# Patient Record
Sex: Female | Born: 1975 | Race: White | Hispanic: No | Marital: Married | Smoking: Current every day smoker
Health system: Southern US, Community
[De-identification: ages and names within clinical notes are randomized; demographics above are authoritative.]

## PROBLEM LIST (undated history)

## (undated) DIAGNOSIS — O09529 Supervision of elderly multigravida, unspecified trimester: Secondary | ICD-10-CM

## (undated) DIAGNOSIS — R011 Cardiac murmur, unspecified: Secondary | ICD-10-CM

## (undated) DIAGNOSIS — I10 Essential (primary) hypertension: Secondary | ICD-10-CM

## (undated) DIAGNOSIS — A630 Anogenital (venereal) warts: Secondary | ICD-10-CM

## (undated) DIAGNOSIS — B009 Herpesviral infection, unspecified: Secondary | ICD-10-CM

## (undated) DIAGNOSIS — Z789 Other specified health status: Secondary | ICD-10-CM

## (undated) DIAGNOSIS — Z87440 Personal history of urinary (tract) infections: Secondary | ICD-10-CM

## (undated) DIAGNOSIS — Z8742 Personal history of other diseases of the female genital tract: Secondary | ICD-10-CM

## (undated) DIAGNOSIS — Z8619 Personal history of other infectious and parasitic diseases: Secondary | ICD-10-CM

## (undated) DIAGNOSIS — B977 Papillomavirus as the cause of diseases classified elsewhere: Secondary | ICD-10-CM

## (undated) HISTORY — DX: Papillomavirus as the cause of diseases classified elsewhere: B97.7

## (undated) HISTORY — DX: Herpesviral infection, unspecified: B00.9

## (undated) HISTORY — DX: Supervision of elderly multigravida, unspecified trimester: O09.529

## (undated) HISTORY — DX: Personal history of urinary (tract) infections: Z87.440

## (undated) HISTORY — DX: Personal history of other diseases of the female genital tract: Z87.42

## (undated) HISTORY — DX: Essential (primary) hypertension: I10

## (undated) HISTORY — DX: Cardiac murmur, unspecified: R01.1

## (undated) HISTORY — DX: Other specified health status: Z78.9

## (undated) HISTORY — DX: Personal history of other infectious and parasitic diseases: Z86.19

## (undated) HISTORY — DX: Anogenital (venereal) warts: A63.0

---
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
## 1999-06-30 DIAGNOSIS — B977 Papillomavirus as the cause of diseases classified elsewhere: Secondary | ICD-10-CM

## 1999-06-30 DIAGNOSIS — A630 Anogenital (venereal) warts: Secondary | ICD-10-CM

## 1999-06-30 HISTORY — DX: Papillomavirus as the cause of diseases classified elsewhere: B97.7

## 1999-06-30 HISTORY — DX: Anogenital (venereal) warts: A63.0

## 2003-01-02 ENCOUNTER — Other Ambulatory Visit: Admission: RE | Admit: 2003-01-02 | Discharge: 2003-01-02 | Payer: Self-pay | Admitting: Obstetrics and Gynecology

## 2003-06-30 DIAGNOSIS — B009 Herpesviral infection, unspecified: Secondary | ICD-10-CM

## 2003-06-30 HISTORY — DX: Herpesviral infection, unspecified: B00.9

## 2003-11-14 ENCOUNTER — Other Ambulatory Visit: Admission: RE | Admit: 2003-11-14 | Discharge: 2003-11-14 | Payer: Self-pay | Admitting: Obstetrics and Gynecology

## 2005-03-24 ENCOUNTER — Other Ambulatory Visit: Admission: RE | Admit: 2005-03-24 | Discharge: 2005-03-24 | Payer: Self-pay | Admitting: Obstetrics and Gynecology

## 2006-03-24 ENCOUNTER — Other Ambulatory Visit: Admission: RE | Admit: 2006-03-24 | Discharge: 2006-03-24 | Payer: Self-pay | Admitting: Obstetrics and Gynecology

## 2008-11-17 ENCOUNTER — Inpatient Hospital Stay (HOSPITAL_COMMUNITY): Admission: AD | Admit: 2008-11-17 | Discharge: 2008-11-19 | Payer: Self-pay | Admitting: Obstetrics and Gynecology

## 2009-06-29 DIAGNOSIS — Z8742 Personal history of other diseases of the female genital tract: Secondary | ICD-10-CM

## 2009-06-29 HISTORY — DX: Personal history of other diseases of the female genital tract: Z87.42

## 2010-08-29 ENCOUNTER — Inpatient Hospital Stay (HOSPITAL_COMMUNITY)
Admission: AD | Admit: 2010-08-29 | Discharge: 2010-08-29 | Disposition: A | Discharge status: Home / Self Care | Payer: 59 | Source: Ambulatory Visit | Attending: Obstetrics and Gynecology | Admitting: Obstetrics and Gynecology

## 2010-08-29 DIAGNOSIS — O139 Gestational [pregnancy-induced] hypertension without significant proteinuria, unspecified trimester: Secondary | ICD-10-CM | POA: Insufficient documentation

## 2010-08-29 LAB — CBC
MCHC: 32.6 g/dL (ref 30.0–36.0)
Platelets: 223 10*3/uL (ref 150–400)
RDW: 14.8 % (ref 11.5–15.5)

## 2010-08-29 LAB — COMPREHENSIVE METABOLIC PANEL
ALT: 25 U/L (ref 0–35)
AST: 27 U/L (ref 0–37)
Calcium: 9 mg/dL (ref 8.4–10.5)
Creatinine, Ser: 0.59 mg/dL (ref 0.4–1.2)
GFR calc Af Amer: >60 mL/min (ref >60)
Sodium: 137 mEq/L (ref 135–145)
Total Protein: 6.2 g/dL (ref 6.0–8.3)

## 2010-08-30 ENCOUNTER — Inpatient Hospital Stay (HOSPITAL_COMMUNITY)
Admission: AD | Admit: 2010-08-30 | Discharge: 2010-08-31 | DRG: 775 | Disposition: A | Discharge status: Home / Self Care | Payer: 59 | Source: Ambulatory Visit | Attending: Obstetrics and Gynecology | Admitting: Obstetrics and Gynecology

## 2010-08-30 DIAGNOSIS — O09529 Supervision of elderly multigravida, unspecified trimester: Secondary | ICD-10-CM | POA: Diagnosis present

## 2010-08-31 LAB — CBC
Hemoglobin: 10.7 g/dL — ABNORMAL LOW (ref 12.0–15.0)
MCH: 28.6 pg (ref 26.0–34.0)
MCV: 85.8 fL (ref 78.0–100.0)
RBC: 3.74 MIL/uL — ABNORMAL LOW (ref 3.87–5.11)
WBC: 16 10*3/uL — ABNORMAL HIGH (ref 4.0–10.5)

## 2010-08-31 LAB — RPR: RPR Ser Ql: NONREACTIVE

## 2010-10-07 LAB — CBC
HCT: 26.6 % — ABNORMAL LOW (ref 36.0–46.0)
HCT: 31.5 % — ABNORMAL LOW (ref 36.0–46.0)
Hemoglobin: 11.2 g/dL — ABNORMAL LOW (ref 12.0–15.0)
MCHC: 35.6 g/dL (ref 30.0–36.0)
MCHC: 35.7 g/dL (ref 30.0–36.0)
MCV: 86.4 fL (ref 78.0–100.0)
RBC: 3.09 MIL/uL — ABNORMAL LOW (ref 3.87–5.11)
RBC: 3.7 MIL/uL — ABNORMAL LOW (ref 3.87–5.11)
RDW: 15.8 % — ABNORMAL HIGH (ref 11.5–15.5)
WBC: 14.1 10*3/uL — ABNORMAL HIGH (ref 4.0–10.5)

## 2010-10-28 ENCOUNTER — Inpatient Hospital Stay (HOSPITAL_COMMUNITY): Admission: AD | Admit: 2010-10-28 | Payer: Self-pay | Source: Home / Self Care | Admitting: Obstetrics and Gynecology

## 2010-11-11 NOTE — H&P (Signed)
NAME:  Natasha Watkins, Natasha Watkins                 ACCOUNT NO.:  [id number]   MEDICAL RECORD NO.:  [id number]          PATIENT TYPE:  INP   LOCATION:  9127                          FACILITY:  [facility]   PHYSICIAN:  Naima A. Dillard, M.D. DATE OF BIRTH:  1975/12/11   DATE OF ADMISSION:  11/17/2008  DATE OF DISCHARGE:                              HISTORY & PHYSICAL   HISTORY OF PRESENT ILLNESS:  The patient is a 35 year old gravida 1,  para 0 admitted at 42-0/7 weeks' gestation for induction of labor due to  postdates.  The patient denies regular uterine contractions, leakage of  fluid, or bleeding.  The patient reports her fetus has been moving  normally.  The patient also denies headaches, vision changes, or right  upper quadrant pain.  The patient's pregnancy has been followed by the  [facility] and remarkable for;  1. 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  2. Tobacco use.  3. Vegetarian diet.  4. Negative group B strep.   HISTORY OF PRESENT PREGNANCY:  The patient entered care at 6 weeks'  gestation.  The patient's LMP January 22, 2008, with [facility] of Oct 27, 2008,  based on date; however, the patient with a history of long menstrual  cycle and EDC of Nov 03, 2008, determined by ultrasound at 6.2 weeks.  The patient declined first trimester screen.  The patient did receive  her H1N1 vaccine at 14 weeks' gestation.  The patient also received  seasonal flu vaccine as well.  The patient underwent quad screen at 18  weeks' gestation.  The patient with ultrasound for anatomy at 19 weeks'  gestation which was within normal limits.  Quad screen also noted to be  within normal limits.  The patient also with complaints of right lower  quadrant pain which was consistent with a pulled groin ligament at 19  weeks' gestation which resolved spontaneously.  The patient underwent  Glucola at 27 weeks with normal results.  The patient with complaints of  left heel numbness which resolved  spontaneously as well.  The patient  with complaints of a possible yeast infection at 29 weeks which she self-  treated with an herbal wash; however, she declined other treatment at  that time.  The patient had started Valtrex 1000 mg daily at 31 weeks  and reported that her external genitalia irritation symptoms resolved  after beginning Valtrex.  At 33 weeks, the patient with complaints of  again possible yeast infection.  At that time, the patient's exam  consisted with vulvovaginal candidiasis and the patient was prescribed  Terazol cream for 7 nights.  The patient's cervix was closed at that  time.  Yeast infection symptoms resolved after Terazol.  The patient  with complaints of exposure to disk disease at 36 weeks; however, her  serum IgG was positive for immunity during at that time.  The patient  underwent group B strep as well as gonorrhea and chlamydia cultures at  35 weeks.  At 39 weeks, the patient with cervix 2 cm and 80%, no HSV  outbreak.  At 40 weeks, the risks, benefits, and alternatives of  induction of labor were discussed and patient declined at that point in  time.  The patient was given fetal kick count instructions.  The patient  with reactive NST at 41 weeks and 4 days and at that point in time,  induction of labor was scheduled.   NEW OBSTETRIC LABORATORY FINDINGS:  Initial new OB lab panel; hemoglobin  12.2, hematocrit 37.1, platelets 304,000, blood type B+, antibody screen  negative, RPR nonreactive, rubella titer immune, hepatitis B surface  antigen negative, Pap smear within normal limits.  In June 2009,  gonorrhea and chlamydia cultures were negative.  Quad screen within  normal limits.  Glucola at 27 weeks 118.  CBC at 27 weeks not noted.  Group B strep negative.   OBSTETRIC HISTORY:  Pregnancy #1, current.   GYNECOLOGIC HISTORY:  The patient with 30-day cycles.  The patient with  no history of abnormal Paps in the past.  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  The patient does  report HPV in 2001.  The patient with a history of occasional  candidiasis in the past.  The patient with no history of contraceptives  use in the past.   PAST MEDICAL HISTORY:  The patient with a heart murmur, but does not  require any treatment.  The patient reports murmur resolved.  The  patient with a history of occasional UTIs.   PAST SURGICAL HISTORY:  Wisdom teeth in 1994.   CURRENT MEDICATIONS:  1. Valtrex 500 mg p.o. daily.  2. Prenatal vitamins 1 tablet daily.   ALLERGIES:  SULFA causes itching.  No known food allergies.  The patient  is not allergic to latex.   FAMILY HISTORY:  Father with hypertension, diabetes.  Sister with  anemia.  Maternal grandmother, thyroid disease.  Half-brother who has  been no history of CVA ischemia.  Half brother, CVA at age 62.  Paternal  grandmother, breast cancer.  Paternal grandfather, lymphoma, liver  cancer.  Paternal aunt, uterine cancer, breast cancer.   GENETIC HISTORY:  Father of the baby with murmur.  The patient's cousin  with Turner syndrome.  Genetic history is otherwise negative.   SOCIAL HISTORY:  The patient is a Runner, broadcasting/film/video at [employer].  The  patient teaches preschoolers.  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  The patient is married  to the father of the baby, Margaret Pyle, who is involved and  supportive.  The father of the baby with 17 years of education and as a  Soil scientist; however, he is currently unemployed.  The patient  continues with tobacco use and reports that she is smoking approximately  5-6 cigarettes per day.  The patient reports nicotine use since age 79.  The patient reports alcohol use of 3-4 glasses of 1 per week prior to  pregnancy.  However, none now.  The patient does report a history of  drug use in her college years, but none currently or in the recent past.   OBJECTIVE:   VITAL SIGNS:  Blood pressure 132/85, pulse 100, respirations  are 16, and patient is afebrile.  GENERAL:  The patient is alert, oriented, no apparent distress.  SKIN:  Warm and dry.  Color is satisfactory.  CHEST:  Lungs are clear to AP auscultation.  BREASTS:  Soft.  HEART:  Regular rate and  rhythm with no murmur, rub, or gallop noted.  ABDOMEN:  Gravid, soft and nontender with bowel sounds present in all 4  quadrants.  Fetus is vertex to [facility] maneuvers.  Estimated fetal  weight of 7.5 pounds.  Fetal heart rate baseline 140s-150s with  accelerations present.  Variability is also present.  No uterine  contractions are noted on toco.  PELVIC:  External genitalia, no visible HSV lesions or areas of  excoriation noted.  Vagina and cervix without lesions noted and a small-  to-moderate amount of thick white discharge noted in the vault.  Sterile  vaginal exam of the cervix found cervix to be 3 cm dilated, 90% effaced,  -1 station vertex and slightly posterior.  EXTREMITIES:  With negative edema, negative Homans bilaterally.  Deep  tendon reflexes are 2+, no clonus.  HEENT:  Within normal limits.  NECK:  Thyroid is normal, not enlarged, and no nodules are noted.   ASSESSMENT:  1. Intrauterine pregnancy at 42-0/7th weeks.  2. Postdates.   PLAN:  The patient admitted to Labor and Delivery for consult with Dr.  Normand Sloop for induction of labor.  The patient desires a non-interventive  birth if possible.  The risks, benefits, and alternatives of artificial  rupture of membranes followed by ambulation or Pitocin discussed with  the patient.  At this time, the patient desires to proceed with  artificial rupture of membranes.  The patient agrees to use of Pitocin  if no labor within 4-6 hours after rupture of membranes.       Rhona Leavens, CNM      Naima A. Normand Sloop, M.D.  Electronically Signed    NOS/MEDQ  D:  11/17/2008  T:  11/18/2008  Job:  [id number]

## 2012-01-13 ENCOUNTER — Ambulatory Visit (INDEPENDENT_AMBULATORY_CARE_PROVIDER_SITE_OTHER): Payer: 59 | Admitting: Obstetrics and Gynecology

## 2012-01-13 ENCOUNTER — Encounter: Payer: Self-pay | Admitting: Obstetrics and Gynecology

## 2012-01-13 VITALS — BP 118/78 | HR 82 | Ht 63.0 in | Wt 122.0 lb

## 2012-01-13 DIAGNOSIS — Z124 Encounter for screening for malignant neoplasm of cervix: Secondary | ICD-10-CM

## 2012-01-13 DIAGNOSIS — Z01419 Encounter for gynecological examination (general) (routine) without abnormal findings: Secondary | ICD-10-CM

## 2012-01-13 NOTE — Progress Notes (Signed)
Subjective:    Natasha Watkins is a 36 y.o. female, G2P2, who presents for an annual exam. The patient has no complaints.   Menstrual cycle:   LMP: Patient's last menstrual period was 01/03/2012.             Review of Systems Pertinent items are noted in HPI. Denies pelvic pain, urinary tract symptoms, vaginitis symptoms, irregular bleeding, menopausal symptoms, change in bowel habits or rectal bleeding   Objective:    BP 118/78  Pulse 82  Ht 5\' 3"  (1.6 m)  Wt 122 lb (55.339 kg)  BMI 21.61 kg/m2  LMP 01/03/2012    Wt Readings from Last 1 Encounters:  01/13/12 122 lb (55.339 kg)   Body mass index is 21.61 kg/(m^2). General Appearance: Alert, no acute distress HEENT: Grossly normal Neck / Thyroid: Supple, no thyromegaly or cervical adenopathy Lungs: Clear to auscultation bilaterally Back: No CVA tenderness Breast Exam: No masses or nodes.No dimpling, nipple retraction or discharge. Cardiovascular: Regular rate and rhythm.  Gastrointestinal: Soft, non-tender, no masses or organomegaly Pelvic Exam: EGBUS-wnl, vagina-normal rugae, cervix- without lesions or tenderness, uterus appears normal size shape and consistency, adnexae-no masses or tenderness Rectovaginal: no masses and normal sphincter tone Lymphatic Exam: Non-palpable nodes in neck, clavicular,  axillary, or inguinal regions  Skin: no rashes or abnormalities Extremities: no clubbing cyanosis or edema  Neurologic: grossly normal Psychiatric: Alert and oriented   Assessment:   Routine GYN Exam   Plan:    PAP sent RTO 1 year or prn  Tiffay Pinette,ELMIRAPA-C

## 2012-01-13 NOTE — Progress Notes (Signed)
Regular Periods: yes Mammogram: no  Monthly Breast Ex.: no Exercise: yes  Tetanus < 10 years: no Seatbelts: yes  NI. Bladder Functn.: yes Abuse at home: no  Daily BM's: yes Stressful Work: yes  Healthy Diet: yes Sigmoid-Colonoscopy: no  Calcium: no Medical problems this year: no problems    LAST PAP:2010 nl  Contraception: none  Mammogram:  no  PCP: no  PMH: no change  FMH: no change  Last Bone Scan: no

## 2012-01-15 LAB — PAP IG W/ RFLX HPV ASCU

## 2012-01-18 ENCOUNTER — Telehealth: Payer: Self-pay

## 2012-01-18 NOTE — Telephone Encounter (Signed)
Tc to pt regarding abnl. Pap . Scheduled pt a colposcopy with avs on 01/26/2012 at 1:30 pm. Micah Flesher over colpo protocol and will send pt a brochure. Pt voiced understanding

## 2012-01-18 NOTE — Telephone Encounter (Signed)
Message copied by Winfred Leeds on Mon Jan 18, 2012 10:17 AM ------      Message from: Henreitta Leber      Created: Mon Jan 18, 2012  7:49 AM       Please schedule for colposcopy.  Thank you.  EP

## 2012-01-18 NOTE — Telephone Encounter (Signed)
Lm for pt to call back

## 2012-01-18 NOTE — Telephone Encounter (Signed)
Message copied by Winfred Leeds on Mon Jan 18, 2012 10:48 AM ------      Message from: Henreitta Leber      Created: Mon Jan 18, 2012  7:49 AM       Please schedule for colposcopy.  Thank you.  EP

## 2012-01-26 ENCOUNTER — Ambulatory Visit (INDEPENDENT_AMBULATORY_CARE_PROVIDER_SITE_OTHER): Payer: 59 | Admitting: Obstetrics and Gynecology

## 2012-01-26 ENCOUNTER — Encounter: Payer: Self-pay | Admitting: Obstetrics and Gynecology

## 2012-01-26 VITALS — BP 110/76 | HR 72 | Wt 121.0 lb

## 2012-01-26 DIAGNOSIS — B977 Papillomavirus as the cause of diseases classified elsewhere: Secondary | ICD-10-CM

## 2012-01-26 DIAGNOSIS — R6889 Other general symptoms and signs: Secondary | ICD-10-CM

## 2012-01-26 DIAGNOSIS — IMO0002 Reserved for concepts with insufficient information to code with codable children: Secondary | ICD-10-CM

## 2012-01-26 DIAGNOSIS — R87619 Unspecified abnormal cytological findings in specimens from cervix uteri: Secondary | ICD-10-CM

## 2012-01-26 NOTE — Progress Notes (Signed)
Previous Pap Smear: 01/13/2012  LSIL/ CIN1  Previous Colposcopy: no Referred From: Henreitta Leber  LMP: 12/29/2011 Contraception:none  G,P: 2;2  HISTORY OF PRESENT ILLNESS  Ms. Natasha Watkins is a 36 y.o. year old female,G2P2002, who presents for a problem visit. 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  Subjective:  No complaints  Objective:  BP 110/76  Pulse 72  Wt 121 lb (54.885 kg)  LMP 01/03/2012  Breastfeeding? Yes   General: alert and no distress GI: nontender  External genitalia: normal general appearance Vaginal: normal without tenderness, induration or masses Cervix: normal appearance Adnexa: normal bimanual exam Uterus: normal size shape and consistency  Urine pregnancy test: Negative  COLPOSCOPY NOTE:  The colposcopy procedure was explained.  The patient's questions were answered. A speculum exam was performed.  The cervix was prepped with acetic acid and Hurricaine gel.  The cervix was evaluated using a white light and the green filter. Findings: white epithelium at 2:00, 6:00, and 11:00 .  The endocervical canal was clear.  No lesions were seen.  Biopsies obtained: 2:00, 6:00, and 11:00.  Hemostasis was adequate.  An endocervical curettage was performed.  Again, hemostasis was adequate. The procedure was terminated.  The patient tolerated her procedure well.  The specimens were sent to pathology.  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  ECC. HPV and Gardasil discussed. Return to office in 2 weeks.   Leonard Schwartz M.D.  01/26/2012 1:48 PM

## 2012-02-01 ENCOUNTER — Encounter: Payer: 59 | Admitting: Obstetrics and Gynecology

## 2012-02-01 LAB — PATHOLOGY

## 2012-02-09 ENCOUNTER — Ambulatory Visit (INDEPENDENT_AMBULATORY_CARE_PROVIDER_SITE_OTHER): Payer: 59 | Admitting: Obstetrics and Gynecology

## 2012-02-09 ENCOUNTER — Encounter: Payer: Self-pay | Admitting: Obstetrics and Gynecology

## 2012-02-09 VITALS — BP 110/70 | Resp 14 | Ht 62.0 in | Wt 123.0 lb

## 2012-02-09 DIAGNOSIS — B977 Papillomavirus as the cause of diseases classified elsewhere: Secondary | ICD-10-CM

## 2012-02-09 DIAGNOSIS — N870 Mild cervical dysplasia: Secondary | ICD-10-CM

## 2012-02-09 NOTE — Progress Notes (Signed)
HISTORY OF PRESENT ILLNESS  Ms. Natasha Watkins is a 35 y.o. year old female,G2P2002, who presents for a problem visit. The patient had colposcopy and biopsies which showed CIN-1 and HPV.  Subjective:  No complaints  Objective:  BP 110/70  Resp 14  Ht 5\' 2"  (1.575 m)  Wt 123 lb (55.792 kg)  BMI 22.50 kg/m2  LMP 02/06/2012   General: no distress  Exam deferred.  Assessment:  CIN-1 HPV  Plan:  The natural history of CIN-1 was discussed.  We will repeat Pap smear in 5 months.  Return to office prn if symptoms worsen or fail to improve.   Leonard Schwartz M.D.  02/09/2012 11:53 AM

## 2012-07-11 ENCOUNTER — Ambulatory Visit (INDEPENDENT_AMBULATORY_CARE_PROVIDER_SITE_OTHER): Payer: 59 | Admitting: Obstetrics and Gynecology

## 2012-07-11 ENCOUNTER — Encounter: Payer: Self-pay | Admitting: Obstetrics and Gynecology

## 2012-07-11 VITALS — BP 90/62 | Ht 62.0 in | Wt 132.0 lb

## 2012-07-11 DIAGNOSIS — N870 Mild cervical dysplasia: Secondary | ICD-10-CM

## 2012-07-11 DIAGNOSIS — IMO0002 Reserved for concepts with insufficient information to code with codable children: Secondary | ICD-10-CM

## 2012-07-11 DIAGNOSIS — A630 Anogenital (venereal) warts: Secondary | ICD-10-CM

## 2012-07-11 DIAGNOSIS — B977 Papillomavirus as the cause of diseases classified elsewhere: Secondary | ICD-10-CM

## 2012-07-11 DIAGNOSIS — R6889 Other general symptoms and signs: Secondary | ICD-10-CM

## 2012-07-11 NOTE — Progress Notes (Signed)
HISTORY OF PRESENT ILLNESS  Ms. Natasha Watkins is a 37 y.o. year old female,G2P2002, who presents for a problem visit. In August of 2013 the patient had a colposcopy with biopsies that show CIN-1 and HPV.  She presents for repeat Pap smear.  Subjective:  Plan well.  Objective:  BP 90/62  Ht 5\' 2"  (1.575 m)  Wt 132 lb (59.875 kg)  BMI 24.14 kg/m2  LMP 06/24/2012   General: no distress GI: soft and nontender  External genitalia: normal general appearance Vaginal: normal without tenderness, induration or masses Cervix: normal appearance Adnexa: normal bimanual exam Uterus: normal size shape and consistency  Assessment:  CIN-1  HPV  Plan:  Pap smear sent  Return to office in 6 month(s).   Leonard Schwartz M.D.  07/11/2012 2:18 PM

## 2012-07-12 LAB — PAP IG W/ RFLX HPV ASCU

## 2012-07-13 NOTE — Progress Notes (Signed)
Quick Note:  Send atypical Pap smear letter with a note to return for repeat Pap in six months. ______ 

## 2012-07-19 ENCOUNTER — Encounter: Payer: Self-pay | Admitting: Obstetrics and Gynecology

## 2012-07-25 ENCOUNTER — Telehealth: Payer: Self-pay | Admitting: Obstetrics and Gynecology

## 2012-07-25 NOTE — Telephone Encounter (Signed)
Pt concerned about ASC-US on pap. Advised pt that this is the most common form of abnormal cells on pap. Per AVS to repeat pap in 6 months.   Pt voiced understanding  Darien Ramus, CMA

## 2014-04-30 ENCOUNTER — Encounter: Payer: Self-pay | Admitting: Obstetrics and Gynecology

## 2016-08-07 ENCOUNTER — Other Ambulatory Visit: Payer: Self-pay | Admitting: Obstetrics and Gynecology

## 2016-08-07 DIAGNOSIS — N6489 Other specified disorders of breast: Secondary | ICD-10-CM

## 2016-08-07 DIAGNOSIS — R928 Other abnormal and inconclusive findings on diagnostic imaging of breast: Secondary | ICD-10-CM

## 2016-08-10 ENCOUNTER — Ambulatory Visit
Admission: RE | Admit: 2016-08-10 | Discharge: 2016-08-10 | Disposition: A | Discharge status: Home / Self Care | Payer: 59 | Source: Ambulatory Visit | Attending: Obstetrics and Gynecology | Admitting: Obstetrics and Gynecology

## 2016-08-10 DIAGNOSIS — R928 Other abnormal and inconclusive findings on diagnostic imaging of breast: Secondary | ICD-10-CM

## 2016-08-10 DIAGNOSIS — N6489 Other specified disorders of breast: Secondary | ICD-10-CM

## 2017-05-05 ENCOUNTER — Other Ambulatory Visit: Payer: Self-pay | Admitting: Otolaryngology

## 2017-05-05 DIAGNOSIS — H9201 Otalgia, right ear: Secondary | ICD-10-CM

## 2017-05-18 ENCOUNTER — Ambulatory Visit
Admission: RE | Admit: 2017-05-18 | Discharge: 2017-05-18 | Disposition: A | Discharge status: Home / Self Care | Payer: 59 | Source: Ambulatory Visit | Attending: Otolaryngology | Admitting: Otolaryngology

## 2017-05-18 DIAGNOSIS — H9201 Otalgia, right ear: Secondary | ICD-10-CM

## 2017-05-18 MED ORDER — GADOBENATE DIMEGLUMINE 529 MG/ML IV SOLN
12.0000 mL | Freq: Once | INTRAVENOUS | Status: AC | PRN
  Administered 2017-05-18: 12 mL via INTRAVENOUS

## 2017-11-03 ENCOUNTER — Ambulatory Visit
Admission: RE | Admit: 2017-11-03 | Discharge: 2017-11-03 | Disposition: A | Discharge status: Home / Self Care | Payer: 59 | Source: Ambulatory Visit | Attending: Family Medicine | Admitting: Family Medicine

## 2017-11-03 ENCOUNTER — Other Ambulatory Visit: Payer: Self-pay | Admitting: Family Medicine

## 2017-11-03 DIAGNOSIS — W19XXXA Unspecified fall, initial encounter: Secondary | ICD-10-CM

## 2019-04-12 ENCOUNTER — Emergency Department (HOSPITAL_COMMUNITY)
Admission: EM | Admit: 2019-04-12 | Discharge: 2019-04-12 | Disposition: A | Discharge status: Home / Self Care | Payer: Managed Care, Other (non HMO) | Attending: Emergency Medicine | Admitting: Emergency Medicine

## 2019-04-12 ENCOUNTER — Emergency Department (HOSPITAL_COMMUNITY): Payer: Managed Care, Other (non HMO)

## 2019-04-12 ENCOUNTER — Encounter (HOSPITAL_COMMUNITY): Payer: Self-pay

## 2019-04-12 ENCOUNTER — Other Ambulatory Visit: Payer: Self-pay

## 2019-04-12 DIAGNOSIS — S61032A Puncture wound without foreign body of left thumb without damage to nail, initial encounter: Secondary | ICD-10-CM | POA: Insufficient documentation

## 2019-04-12 DIAGNOSIS — Y929 Unspecified place or not applicable: Secondary | ICD-10-CM | POA: Insufficient documentation

## 2019-04-12 DIAGNOSIS — Y939 Activity, unspecified: Secondary | ICD-10-CM | POA: Insufficient documentation

## 2019-04-12 DIAGNOSIS — Z23 Encounter for immunization: Secondary | ICD-10-CM | POA: Diagnosis not present

## 2019-04-12 DIAGNOSIS — W540XXA Bitten by dog, initial encounter: Secondary | ICD-10-CM | POA: Insufficient documentation

## 2019-04-12 DIAGNOSIS — F17210 Nicotine dependence, cigarettes, uncomplicated: Secondary | ICD-10-CM | POA: Diagnosis not present

## 2019-04-12 DIAGNOSIS — S61432A Puncture wound without foreign body of left hand, initial encounter: Secondary | ICD-10-CM | POA: Diagnosis present

## 2019-04-12 DIAGNOSIS — Y999 Unspecified external cause status: Secondary | ICD-10-CM | POA: Insufficient documentation

## 2019-04-12 MED ORDER — BACITRACIN ZINC 500 UNIT/GM EX OINT
TOPICAL_OINTMENT | Freq: Two times a day (BID) | CUTANEOUS | Status: DC
  Administered 2019-04-12: 1 via TOPICAL
  Filled 2019-04-12: qty 0.9

## 2019-04-12 MED ORDER — NAPROXEN 250 MG PO TABS
500.0000 mg | ORAL_TABLET | Freq: Once | ORAL | Status: AC
  Administered 2019-04-12: 500 mg via ORAL
  Filled 2019-04-12: qty 2

## 2019-04-12 MED ORDER — AMOXICILLIN-POT CLAVULANATE 875-125 MG PO TABS: 1.0000 | ORAL_TABLET | Freq: Two times a day (BID) | ORAL | 0 refills | Status: DC

## 2019-04-12 MED ORDER — RABIES VACCINE, PCEC IM SUSR: 1.0000 mL | Freq: Once | INTRAMUSCULAR | Status: DC

## 2019-04-12 MED ORDER — AMOXICILLIN-POT CLAVULANATE 875-125 MG PO TABS
1.0000 | ORAL_TABLET | Freq: Once | ORAL | Status: AC
  Administered 2019-04-12: 1 via ORAL
  Filled 2019-04-12: qty 1

## 2019-04-12 MED ORDER — RABIES IMMUNE GLOBULIN 150 UNIT/ML IM INJ: 20.0000 [IU]/kg | INJECTION | Freq: Once | INTRAMUSCULAR | Status: DC

## 2019-04-12 MED ORDER — TETANUS-DIPHTH-ACELL PERTUSSIS 5-2.5-18.5 LF-MCG/0.5 IM SUSP
0.5000 mL | Freq: Once | INTRAMUSCULAR | Status: AC
  Administered 2019-04-12: 22:00:00 0.5 mL via INTRAMUSCULAR
  Filled 2019-04-12: qty 0.5

## 2019-04-12 NOTE — Discharge Instructions (Addendum)
You are seen today for dog bite.  We cleaned your wound and applied an antibiotic ointment and wrapped it with gauze.  We also updated your tetanus shot and gave you an oral antibiotic pill.  Your x-ray did not show any signs of any fracture in your bones or any dog teeth left behind. Sine you are confident that the dog is up to date on rabies vaccine, you do not ned to have a rabies shot today.  If you discover that the dog is not up-to-date on its rabies vaccines please quarantine the dog for 10 days and if the dog exhibits any abnormal behavior you should come immediately to the emergency department for rabies vaccine.  If the dog cannot be found and quarantined and you do not know the rabies vaccine status you should return to the emergency department to get a rabies shot.  Please make sure you clean your wound daily with regular antibacterial soap and take the antibiotics as prescribed.  If you have any signs of infection such as increased redness, swelling, pain or pus draining from the wound please return to the emergency department.  You should keep your hand elevated as much as possible and apply ice for 20 minutes at a time, 3 times a day to help with the swelling.  You can also take ibuprofen or Aleve as needed for pain and swelling.

## 2019-04-12 NOTE — ED Provider Notes (Signed)
[facility] EMERGENCY DEPARTMENT Provider Note   CSN: [id number] Arrival date & time: 04/12/19  2015     History   Chief Complaint Chief Complaint  Patient presents with  . Animal Bite    HPI Natasha Watkins is a 43 y.o. female.     Patient is a 43 year old female with no significant past medical history presenting to the emergency department for dog bite to left hand which occurred just prior to arrival.  Patient reports that she went to pet a dog and it bit her on the left hand.  Initially the patient reported that the dog was a stray and she did not know where the dog went.  She then reported that it is actually her friend's dog and they know the dog is up-to-date on its shots but she did not want the dog to get in trouble.  Patient has unknown tetanus shot status.  She reports that she is having some pain and swelling in her hand but no significant bleeding.  She is not on anticoagulation and has no other major medical problems.     Past Medical History:  Diagnosis Date  . AMA (advanced maternal age) multigravida 59+   . Condylomata acuminata in female 2001   HPV  . H/O amenorrhea 2011  . H/O candidiasis   . H/O varicella   . Heart murmur   . Herpes 2005  . HPV in female 2001  . Hx: UTI (urinary tract infection)    Occasionally  . Vegetarian     Patient Active Problem List   Diagnosis Date Noted  . 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 Pap smear abnormality of cervix 01/26/2012  . HPV in female 01/26/2012    Past Surgical History:  Procedure Laterality Date  . WISDOM TOOTH EXTRACTION  1994     OB History    Gravida  2   Para  2   Term  2   Preterm      AB      Living  2     SAB      TAB      Ectopic      Multiple      Live Births  2            Home Medications    Prior to Admission medications   Medication Sig Start Date End Date Taking? Authorizing Provider  amoxicillin-clavulanate (AUGMENTIN)  875-125 MG tablet Take 1 tablet by mouth every 12 (twelve) hours. 04/12/19   Alveria Apley, PA-C  trimethoprim-polymyxin b (POLYTRIM) ophthalmic solution Place 1 drop into both eyes every 4 (four) hours.    [provider]    Family History Family History  Problem Relation Age of Onset  . Hypertension Father   . Hyperlipidemia Father   . Diabetes Father   . Cancer Maternal Grandfather        liver  . Cancer Paternal Grandmother        breast  . Breast cancer Paternal Grandmother   . Cancer Paternal Grandfather        lymphoma  . Stroke Brother   . Cancer Maternal Aunt        Uterine  . Cancer Paternal Aunt        Breast  . Breast cancer Paternal Aunt     Social History Social History   Tobacco Use  . Smoking status: Current Every Day Smoker  . Smokeless tobacco: Never  Used  . Tobacco comment: 10 cigarettes a day  Substance Use Topics  . Alcohol use: Yes    Comment: 2 x a week  . Drug use: No     Allergies   Sulfa antibiotics   Review of Systems Review of Systems  Constitutional: Negative for chills and fever.  Skin: Positive for wound.  Allergic/Immunologic: Negative for immunocompromised state.  Neurological: Negative for light-headedness.  Hematological: Does not bruise/bleed easily.  All other systems reviewed and are negative.    Physical Exam Updated Vital Signs BP (!) 157/88 (BP Location: Right Arm)   Pulse 82   Temp 98.2 F (36.8 C) (Oral)   Resp 18   Wt 59.9 kg   SpO2 100%   BMI 24.14 kg/m   Physical Exam Vitals signs and nursing note reviewed.  Constitutional:      General: She is not in acute distress.    Appearance: Normal appearance. She is not ill-appearing, toxic-appearing or diaphoretic.  HENT:     Head: Normocephalic and atraumatic.     Mouth/Throat:     Mouth: Mucous membranes are moist.  Eyes:     Conjunctiva/sclera: Conjunctivae normal.  Pulmonary:     Effort: Pulmonary effort is normal.  Skin:    General:  Skin is dry.     Capillary Refill: Capillary refill takes less than 2 seconds.       Neurological:     Mental Status: She is alert.  Psychiatric:        Mood and Affect: Mood normal.      ED Treatments / Results  Labs (all labs ordered are listed, but only abnormal results are displayed) Labs Reviewed - No data to display  EKG None  Radiology Dg Hand Complete Left  Result Date: 04/12/2019 CLINICAL DATA:  Dog bite EXAM: LEFT HAND - COMPLETE 3+ VIEW COMPARISON:  None. FINDINGS: External bandages overlie the thenar and hypothenar left hand. Tiny focus of soft tissue gas adjacent to the first metacarpal. No radiopaque foreign body. No fracture or dislocation. No suspicious focal osseous lesion. IMPRESSION: Tiny focus of soft tissue gas adjacent to the first metacarpal. No fracture or malalignment. No radiopaque foreign body. Electronically Signed   By: Delbert Phenix M.D.   On: 04/12/2019 21:30    Procedures Procedures (including critical care time)  Medications Ordered in ED Medications  amoxicillin-clavulanate (AUGMENTIN) 875-125 MG per tablet 1 tablet (has no administration in time range)  bacitracin ointment (has no administration in time range)  Tdap (BOOSTRIX) injection 0.5 mL (has no administration in time range)  naproxen (NAPROSYN) tablet 500 mg (has no administration in time range)     Initial Impression / Assessment and Plan / ED Course  I have reviewed the triage vital signs and the nursing notes.  Pertinent labs & imaging results that were available during my care of the patient were reviewed by me and considered in my medical decision making (see chart for details).  Clinical Course as of Apr 11 2138  Wed Apr 12, 2019  2124 Patient here for dog bite to the left thumb.  Bleeding controlled.  Tetanus was updated for the patient today patient reports that she knows the dog and it is up-to-date on her rabies shots.  Patient was started on Augmentin and given naproxen  for pain.  Advised to rest, ice, compression and elevation of her hand and advised on strict return precautions for signs of infection.  X-ray of the left hand revealed    [  KM]    Clinical Course User Index [KM] Arlyn DunningMcLean, Katisha Shimizu A, PA-C       Based on review of vitals, medical screening exam, lab work and/or imaging, there does not appear to be an acute, emergent etiology for the patient's symptoms. Counseled pt on good return precautions and encouraged both PCP and ED follow-up as needed.  Prior to discharge, I also discussed incidental imaging findings with patient in detail and advised appropriate, recommended follow-up in detail.  Clinical Impression: 1. Dog bite, initial encounter     Disposition: Discharge  Prior to providing a prescription for a controlled substance, I independently reviewed the patient's recent prescription history on the [place] Controlled Substance Reporting System. The patient had no recent or regular prescriptions and was deemed appropriate for a brief, less than 3 day prescription of narcotic for acute analgesia.  This note was prepared with assistance of Conservation officer, historic buildingsDragon voice recognition software. Occasional wrong-word or sound-a-like substitutions may have occurred due to the inherent limitations of voice recognition software.   Final Clinical Impressions(s) / ED Diagnoses   Final diagnoses:  Dog bite, initial encounter    ED Discharge Orders         Ordered    amoxicillin-clavulanate (AUGMENTIN) 875-125 MG tablet  Every 12 hours     04/12/19 2139           Jeral PinchMcLean, Tyna Huertas A, PA-C 04/12/19 2139    Terrilee FilesButler, Michael C, MD 04/15/19 [phone]

## 2019-04-12 NOTE — ED Triage Notes (Signed)
Pt states she was on a walk when a stray dog bit her on her left hand, 3 small puncture wounds noted to hand. Hand irrigated and wrapped with sterile saline in triage.

## 2019-08-10 ENCOUNTER — Other Ambulatory Visit: Payer: Self-pay | Admitting: Obstetrics and Gynecology

## 2019-08-10 DIAGNOSIS — R928 Other abnormal and inconclusive findings on diagnostic imaging of breast: Secondary | ICD-10-CM

## 2019-08-23 ENCOUNTER — Other Ambulatory Visit: Payer: Self-pay | Admitting: Obstetrics and Gynecology

## 2019-08-23 ENCOUNTER — Ambulatory Visit
Admission: RE | Admit: 2019-08-23 | Discharge: 2019-08-23 | Disposition: A | Discharge status: Home / Self Care | Payer: Managed Care, Other (non HMO) | Source: Ambulatory Visit | Attending: Obstetrics and Gynecology | Admitting: Obstetrics and Gynecology

## 2019-08-23 ENCOUNTER — Other Ambulatory Visit: Payer: Self-pay

## 2019-08-23 DIAGNOSIS — R928 Other abnormal and inconclusive findings on diagnostic imaging of breast: Secondary | ICD-10-CM

## 2019-08-23 DIAGNOSIS — N6310 Unspecified lump in the right breast, unspecified quadrant: Secondary | ICD-10-CM

## 2019-08-31 ENCOUNTER — Other Ambulatory Visit: Payer: Self-pay

## 2019-08-31 ENCOUNTER — Ambulatory Visit
Admission: RE | Admit: 2019-08-31 | Discharge: 2019-08-31 | Disposition: A | Discharge status: Home / Self Care | Payer: Managed Care, Other (non HMO) | Source: Ambulatory Visit | Attending: Obstetrics and Gynecology | Admitting: Obstetrics and Gynecology

## 2019-08-31 DIAGNOSIS — N6310 Unspecified lump in the right breast, unspecified quadrant: Secondary | ICD-10-CM

## 2020-06-28 IMAGING — MG MM DIGITAL DIAGNOSTIC UNILAT*R* W/ TOMO W/ CAD
8 series · 8 of 24 positions shown · non-contrast
Comparison: Previous exam(s).

CLINICAL DATA: Screening recall for a right breast asymmetry.

EXAM:
DIGITAL DIAGNOSTIC RIGHT MAMMOGRAM WITH CAD AND TOMO
ULTRASOUND RIGHT BREAST

[R CC synth-2D (1 of 2)]
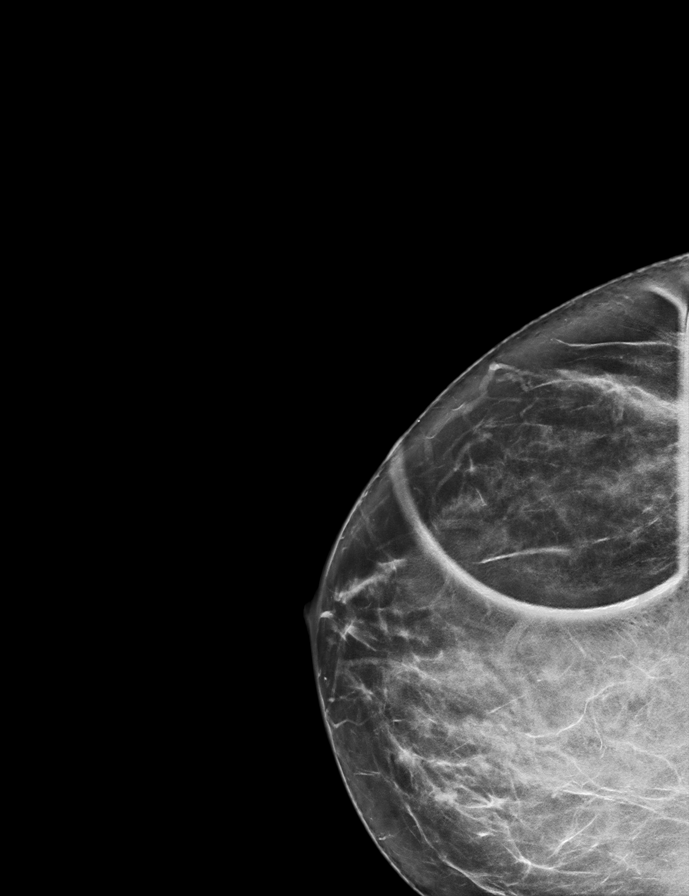

[R CC synth-2D (2 of 2)]
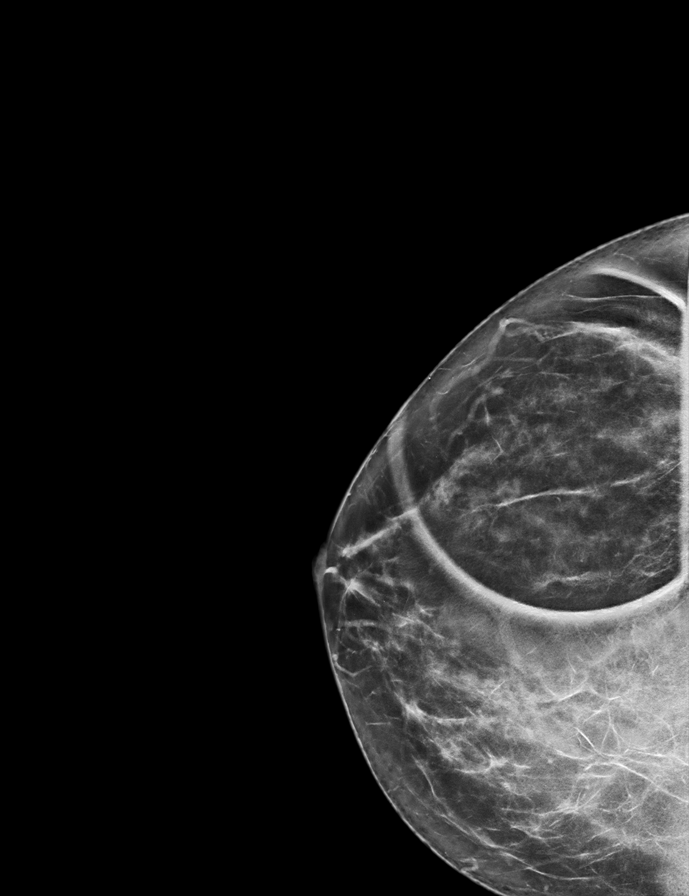

[R ML synth-2D (1 of 2)]
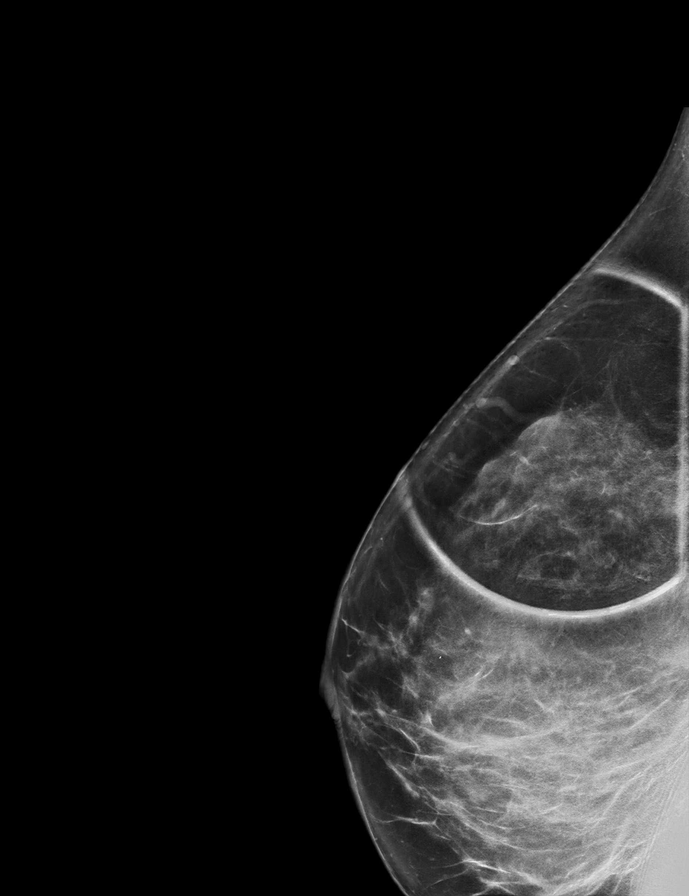

[R ML synth-2D (2 of 2)]
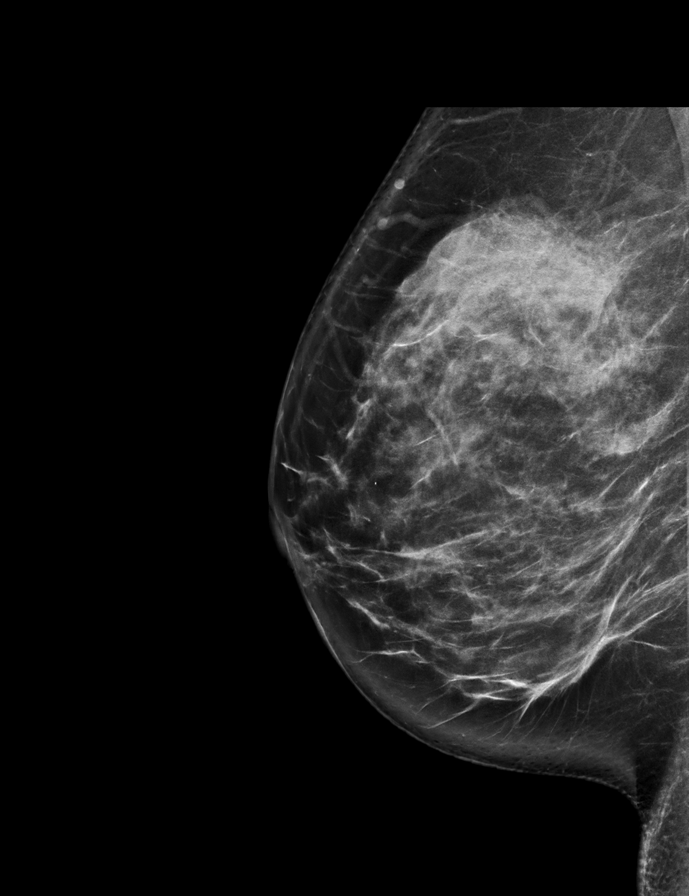

[R CC tomo (1 of 2) · tomo slice 37/74.0]
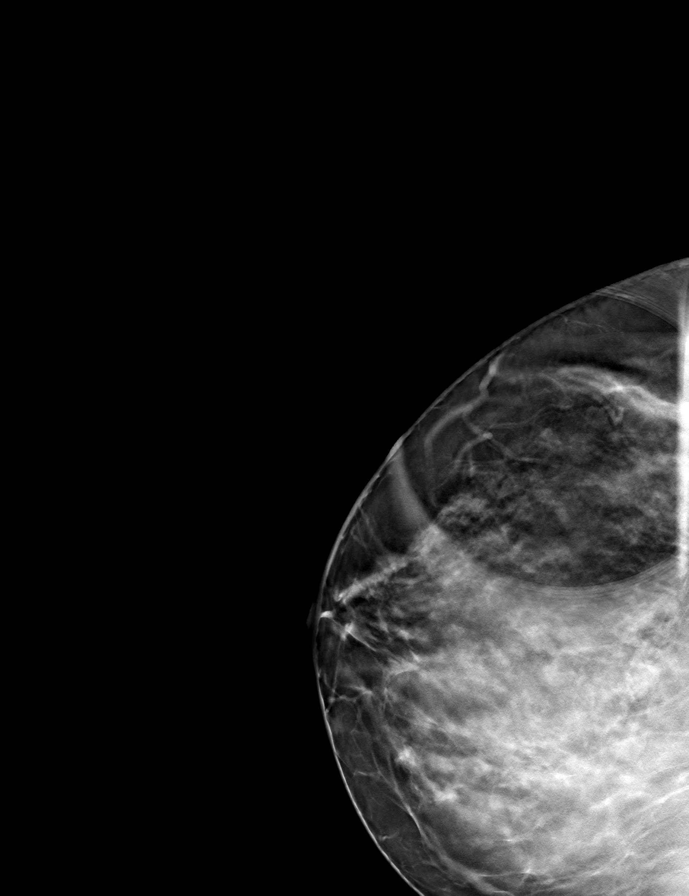

[R CC tomo (2 of 2) · tomo slice 39/76.0]
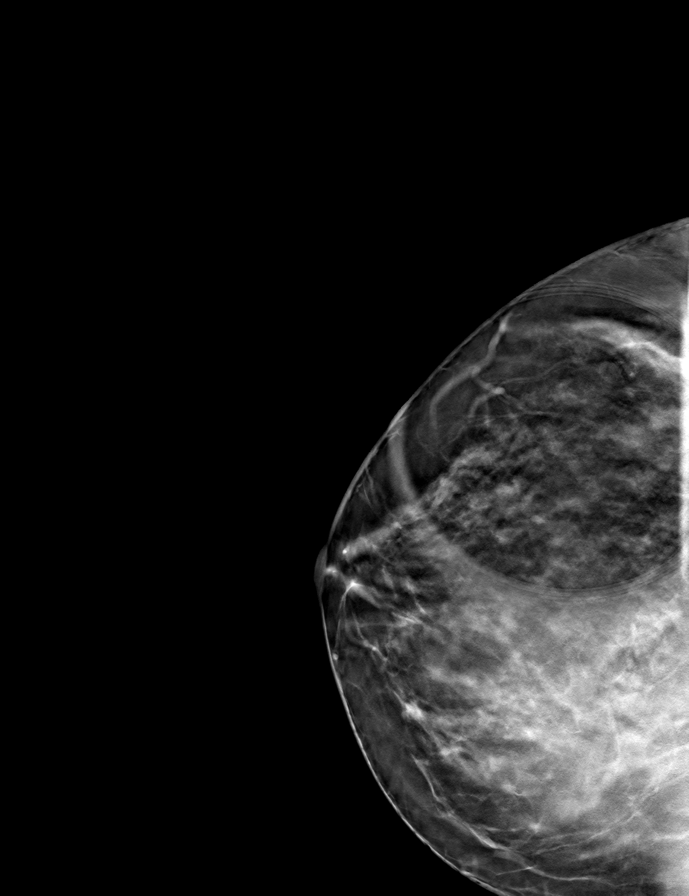

[R ML tomo (1 of 2) · tomo slice 45/90.0]
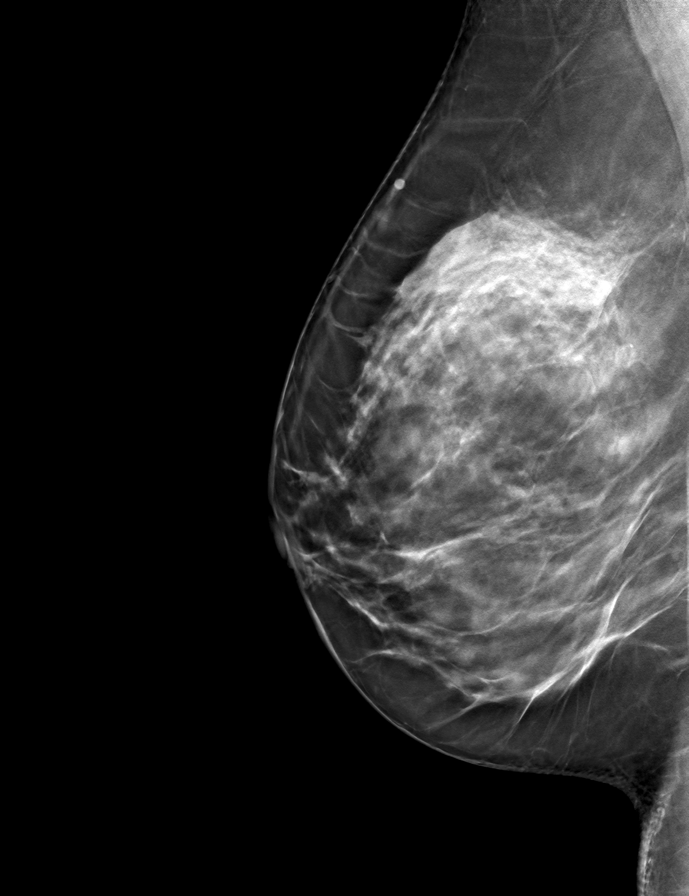

[R ML tomo (2 of 2) · tomo slice 37/74.0]
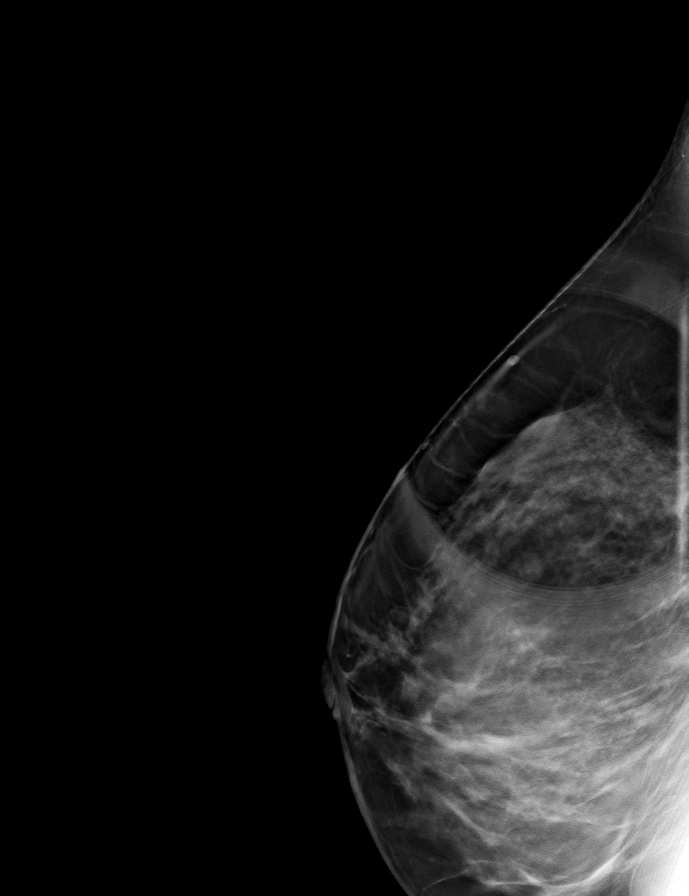

[8 of 24 positions shown; findings below may reference images not displayed]

ACR Breast Density Category c: The breast tissue is heterogeneously
dense, which may obscure small masses.
FINDINGS: Spot compression tomosynthesis images reveal a possible obscured
mass in the superior to upper-outer quadrant of the right breast.
However, due to the density of the surrounding tissue, ultrasound
will be performed for further evaluation.

Mammographic images were processed with CAD.

Ultrasound of the right breast at 12 o'clock, 5 cm from the nipple
demonstrates an oval hypoechoic vascular mass with an indistinct
inferior margin measuring 1.7 x 0.8 x 1.5 cm. Ultrasound of the
right axilla demonstrates multiple normal-appearing lymph nodes.
IMPRESSION: 1. There is an indeterminate mass in the right breast at 12 o'clock.

2.  No evidence of right axillary lymphadenopathy.

RECOMMENDATION:
Ultrasound guided biopsy is recommended for the right breast mass.
This has been scheduled for 08/31/2019 at [DATE] p.m.

I have discussed the findings and recommendations with the patient.
If applicable, a reminder letter will be sent to the patient
regarding the next appointment.

BI-RADS CATEGORY  4: Suspicious.

## 2020-07-06 IMAGING — US US  BREAST BX W/ LOC DEV 1ST LESION IMG BX SPEC US GUIDE*R*
1 series · 10 of 10 positions shown · non-contrast
Comparison: Previous exam(s).
COMPARISON: Previous exam(s).

Addendum:
CLINICAL DATA: 44-year-old female presenting for ultrasound-guided
biopsy of the right breast mass.

EXAM:
ULTRASOUND GUIDED RIGHT BREAST CORE NEEDLE BIOPSY

[Series 1: us breast bx w/ loc dev 1st lesion img bx spec us  · 0.07mm/px · 10 of 10 slices shown]
[im 1/10]
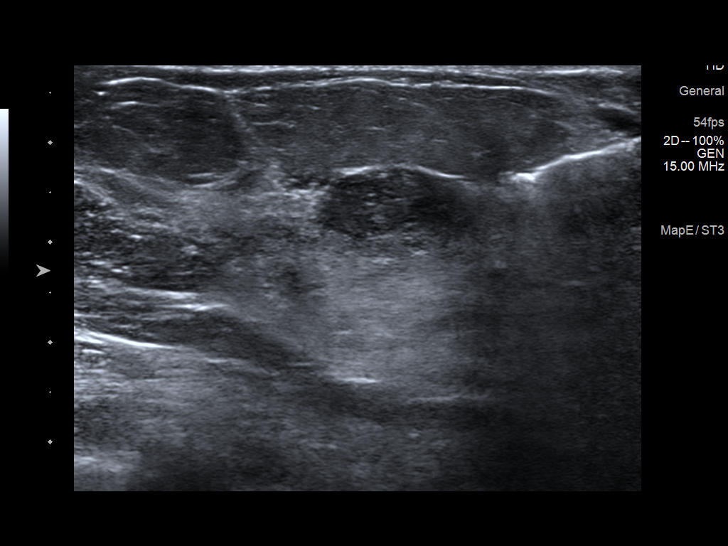
[im 2/10]
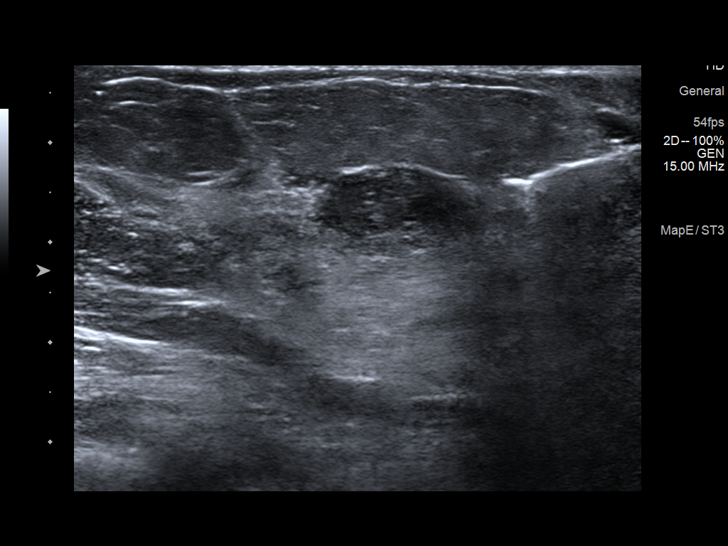
[im 3/10]
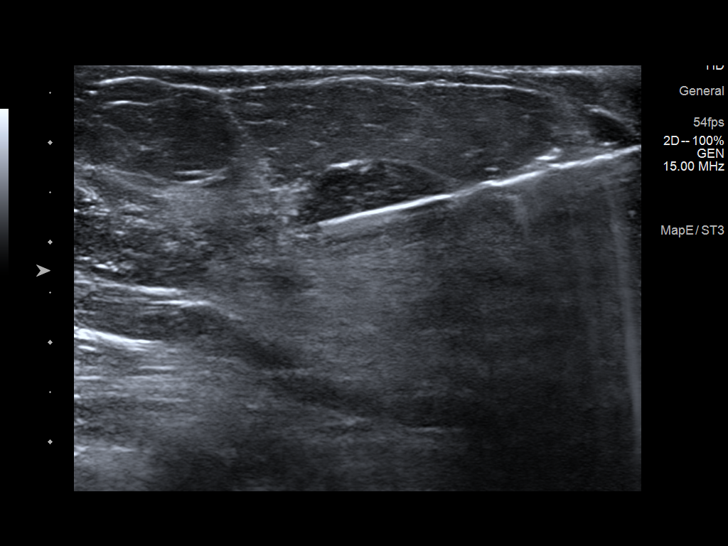
[im 4/10]
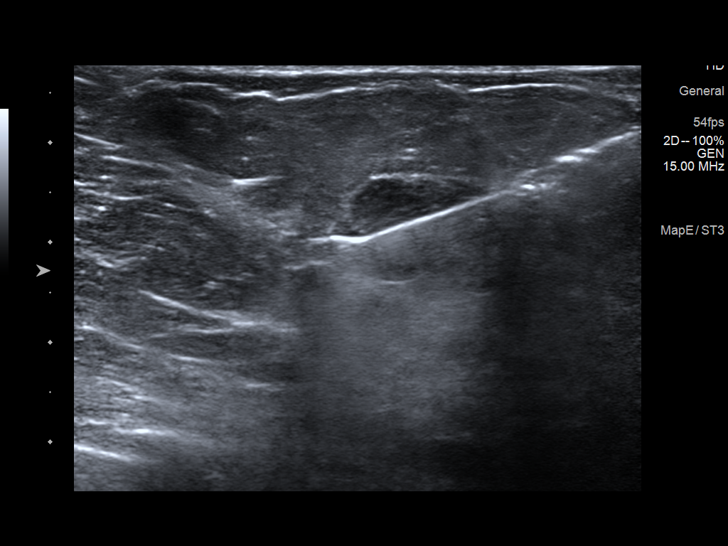
[im 5/10]
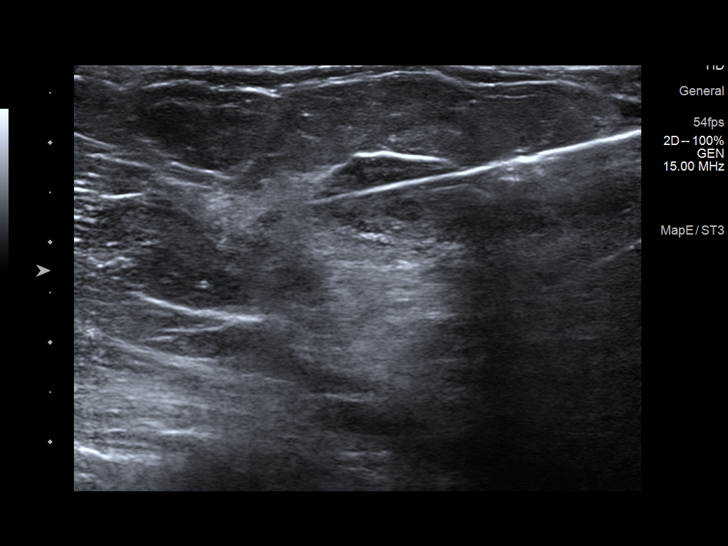
[im 6/10]
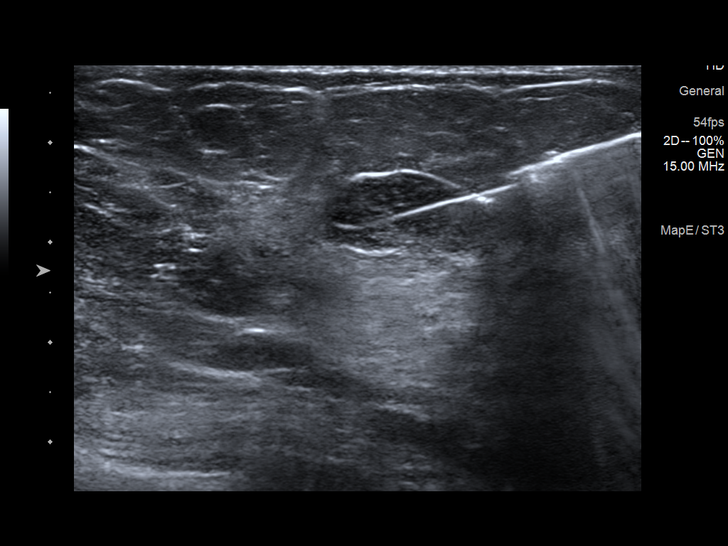
[im 7/10]
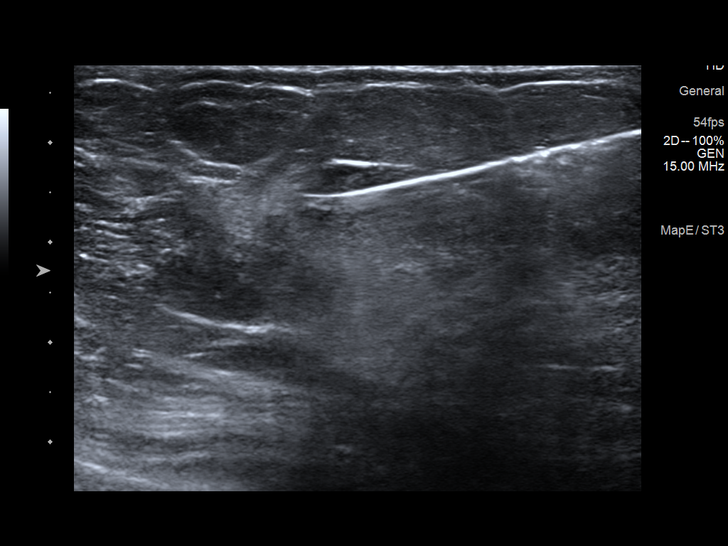
[im 8/10]
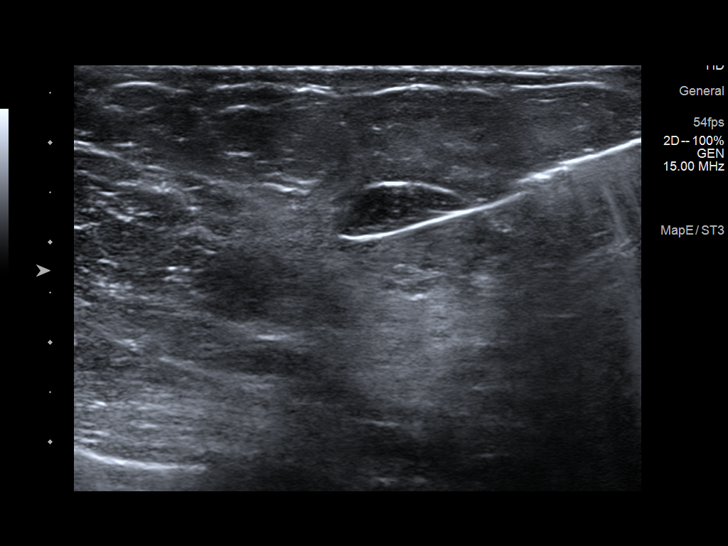
[im 9/10]
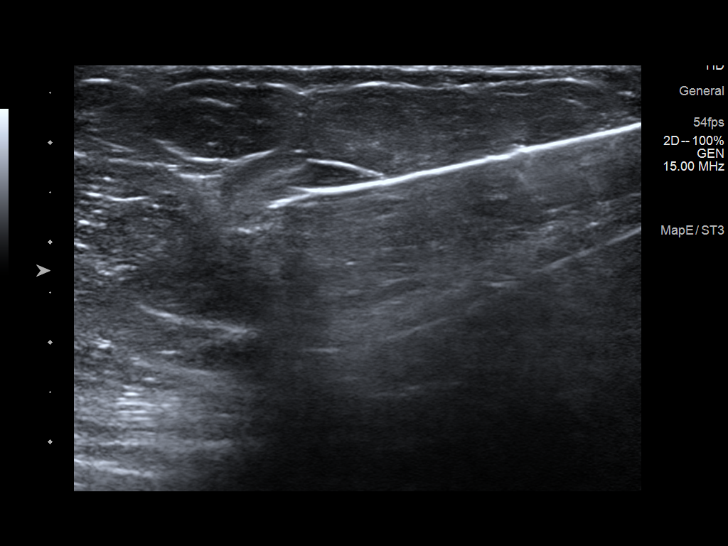
[im 10/10]
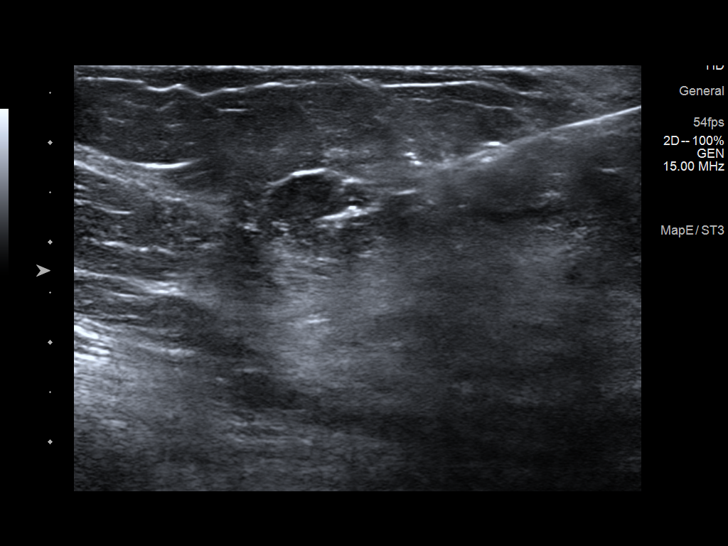

[10 of 10 positions shown; findings below may reference images not displayed]



Lesion quadrant: Upper-outer quadrant

Using sterile technique and 1% Lidocaine as local anesthetic, under
direct ultrasound visualization, a 14 gauge Mizlul device was
used to perform biopsy of a mass in the right breast at 12 o'clock
using a inferolateral approach. At the conclusion of the procedure
ribbon shaped tissue marker clip was deployed into the biopsy
cavity. Follow up 2 view mammogram was performed and dictated
separately.
IMPRESSION: Ultrasound guided biopsy of a right breast mass at 12 o'clock. No
apparent complications.

ADDENDUM:
Pathology revealed FIBROADENOMA of the Right breast, 12 o'clock.
This was found to be concordant by Dr. Liwonjo Delmas.

Pathology results were discussed with the patient by telephone. The
patient reported doing well after the biopsy with tenderness at the
site. Post biopsy instructions and care were reviewed and questions
were answered. The patient was encouraged to call The [REDACTED]

The patient was instructed to return for annual screening
mammography at [REDACTED] [facility] in [HOSPITAL][HOSPITAL].

Pathology results reported by Wing Jiron, RN on 09/01/2019.



Lesion quadrant: Upper-outer quadrant

Using sterile technique and 1% Lidocaine as local anesthetic, under
direct ultrasound visualization, a 14 gauge Mizlul device was
used to perform biopsy of a mass in the right breast at 12 o'clock
using a inferolateral approach. At the conclusion of the procedure
ribbon shaped tissue marker clip was deployed into the biopsy
cavity. Follow up 2 view mammogram was performed and dictated
separately.
IMPRESSION: Ultrasound guided biopsy of a right breast mass at 12 o'clock. No
apparent complications.

## 2023-02-11 ENCOUNTER — Encounter (HOSPITAL_BASED_OUTPATIENT_CLINIC_OR_DEPARTMENT_OTHER): Payer: Self-pay | Admitting: Obstetrics & Gynecology

## 2023-03-08 ENCOUNTER — Encounter (HOSPITAL_BASED_OUTPATIENT_CLINIC_OR_DEPARTMENT_OTHER): Payer: Self-pay | Admitting: Obstetrics & Gynecology

## 2023-03-08 ENCOUNTER — Encounter (HOSPITAL_BASED_OUTPATIENT_CLINIC_OR_DEPARTMENT_OTHER): Payer: Self-pay

## 2023-07-05 ENCOUNTER — Encounter (HOSPITAL_BASED_OUTPATIENT_CLINIC_OR_DEPARTMENT_OTHER): Payer: Self-pay | Admitting: Obstetrics & Gynecology

## 2023-07-05 ENCOUNTER — Other Ambulatory Visit (HOSPITAL_COMMUNITY)
Admission: RE | Admit: 2023-07-05 | Discharge: 2023-07-05 | Disposition: A | Discharge status: Home / Self Care | Payer: BC Managed Care – PPO | Source: Ambulatory Visit | Attending: Obstetrics & Gynecology | Admitting: Obstetrics & Gynecology

## 2023-07-05 ENCOUNTER — Ambulatory Visit (HOSPITAL_BASED_OUTPATIENT_CLINIC_OR_DEPARTMENT_OTHER): Payer: BC Managed Care – PPO | Admitting: Obstetrics & Gynecology

## 2023-07-05 VITALS — BP 119/71 | HR 87 | Ht 62.5 in | Wt 159.6 lb

## 2023-07-05 DIAGNOSIS — Z01419 Encounter for gynecological examination (general) (routine) without abnormal findings: Secondary | ICD-10-CM

## 2023-07-05 DIAGNOSIS — N393 Stress incontinence (female) (male): Secondary | ICD-10-CM

## 2023-07-05 DIAGNOSIS — B977 Papillomavirus as the cause of diseases classified elsewhere: Secondary | ICD-10-CM | POA: Insufficient documentation

## 2023-07-05 DIAGNOSIS — Z1151 Encounter for screening for human papillomavirus (HPV): Secondary | ICD-10-CM | POA: Diagnosis not present

## 2023-07-05 DIAGNOSIS — Z124 Encounter for screening for malignant neoplasm of cervix: Secondary | ICD-10-CM

## 2023-07-05 DIAGNOSIS — N870 Mild cervical dysplasia: Secondary | ICD-10-CM | POA: Diagnosis not present

## 2023-07-05 DIAGNOSIS — A6000 Herpesviral infection of urogenital system, unspecified: Secondary | ICD-10-CM

## 2023-07-05 MED ORDER — VALACYCLOVIR HCL 500 MG PO TABS: ORAL_TABLET | ORAL | 1 refills | Status: DC

## 2023-07-05 NOTE — Progress Notes (Signed)
 48 y.o. [id number] Married White or Caucasian female here for annual exam/new patient exam.  She has hx of abnormal pap smear.  Has been seen in the past by [facility].    Is having some issues with incontinence especially with sneezing or exercise.  Occasionally will leak when close to the bathroom.  Discussed urogyn referral or pelvic PT.    H/o HSV.  Valtrex  discussed.  She would like rx.    Menstrual cycles are still normal.  Flow lasts 4 days.  First 1-2 days are heavy.        Sexually active: Yes.    The current method of family planning is vasectomy.    Smoker:  no  Health Maintenance: Pap:  obtained today History of abnormal Pap:  with h/o CIN 1 MMG:  09/16/2022 Colonoscopy:  past two years have done the mail in guaiac testing  Screening Labs: does with Dr. Rolinda   reports that she quit smoking about 5 years ago. Her smoking use included cigarettes. She has never used smokeless tobacco. She reports current alcohol use. She reports that she does not use drugs.  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     Probiotic Product (PROBIOTIC ADVANCED PO) Take by mouth.     valACYclovir  (VALTREX ) 500 MG tablet Take 1 tablet BID x 3 days with symptom onset 30 tablet 1   No current facility-administered medications for this visit.    Family History  Problem Relation Age of Onset   Hypertension Father    Hyperlipidemia Father    Diabetes Father    Cancer Maternal Grandfather        liver   Cancer Paternal Grandmother         breast   Breast cancer Paternal Grandmother    Cancer Paternal Grandfather        lymphoma   Stroke Brother    Cancer Maternal Aunt        Uterine   Cancer Paternal Aunt        Breast   Breast cancer Paternal Aunt     ROS: Constitutional: negative Genitourinary:negative  Exam:   BP 119/71 (BP Location: Left Arm, Patient Position: Sitting, Cuff Size: Large)   Pulse 87   Ht 5' 2.5 (1.588 m)   Wt 159 lb 9.6 oz (72.4 kg)   LMP 06/27/2023 (Approximate)   BMI 28.73 kg/m   Height: 5' 2.5 (158.8 cm)  General appearance: alert, cooperative and appears stated age Head: Normocephalic, without obvious abnormality, atraumatic Neck: no adenopathy, supple, symmetrical, trachea midline and thyroid normal to inspection and palpation Lungs: clear to auscultation bilaterally Breasts: normal appearance, no masses or tenderness Heart: regular rate and rhythm Abdomen: soft, non-tender; bowel sounds normal; no masses,  no organomegaly Extremities: extremities normal, atraumatic, no cyanosis or edema Skin: Skin color, texture, turgor normal. No rashes or lesions Lymph nodes: Cervical, supraclavicular, and axillary nodes normal. No abnormal inguinal nodes palpated Neurologic: Grossly normal  Pelvic: External genitalia:  no lesions              Urethra:  normal appearing urethra with no masses, tenderness or lesions              Bartholins and Skenes: normal                 Vagina: normal appearing vagina with normal color and no discharge, no lesions, 2nd degree cystocele              Cervix: no lesions              Pap taken: Yes.   Bimanual Exam:  Uterus:  normal size, contour, position, consistency, mobility, non-tender              Adnexa: normal adnexa and no mass, fullness, tenderness               Rectovaginal: Confirms               Anus:  normal sphincter tone, no lesions  Chaperone, Bascom Kotyk, CMA, was present for exam.  Assessment/Plan: 1. Well woman exam with  routine gynecological exam (Primary) - Pap smear obtained today with HR HPV - Mammogram 08/2022 - Colonoscopy discussed.  Doing guaiac testing with PCP. - lab work done with PCP, Dr. Rolinda - vaccines reviewed/updated  2. Dysplasia of cervix, low grade (CIN 1)  3. HPV in female - Cytology - PAP( [place])  4. Cervical cancer screening  5. SUI (stress urinary incontinence, female) - Ambulatory referral to Physical Therapy  6. Genital herpes simplex, unspecified site - valACYclovir  (VALTREX ) 500 MG tablet; Take 1 tablet BID x 3 days with symptom onset  Dispense: 30 tablet; Refill: 1

## 2023-07-06 LAB — CYTOLOGY - PAP
Comment: NEGATIVE
Diagnosis: NEGATIVE
High risk HPV: NEGATIVE

## 2023-07-19 ENCOUNTER — Encounter (HOSPITAL_BASED_OUTPATIENT_CLINIC_OR_DEPARTMENT_OTHER): Payer: Self-pay | Admitting: Obstetrics & Gynecology

## 2023-08-23 DIAGNOSIS — R92333 Mammographic heterogeneous density, bilateral breasts: Secondary | ICD-10-CM | POA: Diagnosis not present

## 2023-08-23 DIAGNOSIS — Z1231 Encounter for screening mammogram for malignant neoplasm of breast: Secondary | ICD-10-CM | POA: Diagnosis not present

## 2023-09-06 DIAGNOSIS — Z111 Encounter for screening for respiratory tuberculosis: Secondary | ICD-10-CM | POA: Diagnosis not present

## 2023-09-20 NOTE — Therapy (Signed)
 OUTPATIENT PHYSICAL THERAPY FEMALE PELVIC EVALUATION   Patient Name: Natasha Watkins MRN: [id number] DOB:24-Mar-1976, 48 y.o., female Today's Date: 09/21/2023  END OF SESSION:  PT End of Session - 09/21/23 1749     Visit Number 1    Authorization Type waiting on auth - [employer]    PT Start Time 1500    PT Stop Time 1530    PT Time Calculation (min) 30 min    Activity Tolerance Patient tolerated treatment well    Behavior During Therapy [facility] for tasks assessed/performed             Past Medical History:  Diagnosis Date   AMA (advanced maternal age) multigravida 35+    Condylomata acuminata in female 06/30/1999   H/O varicella    Herpes 06/30/2003   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   Hx: UTI (urinary tract infection)    Occasionally   Hypertension    Vegetarian    Past Surgical History:  Procedure Laterality Date   WISDOM TOOTH EXTRACTION  1994   Patient Active Problem List   Diagnosis Date Noted   Dysplasia of cervix, low grade (CIN 1) 02/09/2012   Pap smear abnormality of cervix 01/26/2012   HPV in female 01/26/2012    PCP: Aliene Beams, MD  REFERRING PROVIDER: Jerene Bears, MD   REFERRING DIAG: N39.3 (ICD-10-CM) - SUI (stress urinary incontinence, female)  THERAPY DIAG:  Unspecified lack of coordination  Muscle weakness (generalized)  Rationale for Evaluation and Treatment: Rehabilitation  ONSET DATE: 14 years ago   SUBJECTIVE:                                                                                                                                                                                           SUBJECTIVE STATEMENT: Pt reports that she has had SUI that started after her children 13,14 - when she coughs, jumps, sneezes, a little pee comes out.  She has urge incontinence as  well, when she sees a bathroom, she has to go pee.      Fluid intake: coffee, water, carbonated water  PAIN:  Are you having pain? No  PRECAUTIONS: None  RED  FLAGS: None   WEIGHT BEARING RESTRICTIONS: No  FALLS:  Has patient fallen in last 6 months? No  OCCUPATION: preschool consultant  ACTIVITY LEVEL : plays tennis, walks, yoga  PLOF: Independent  PATIENT GOALS: increase pelvic floor strength to decrease urinary incontinence  PERTINENT HISTORY:  no Sexual abuse: No  BOWEL MOVEMENT: no issues  URINATION: Pain with urination: No Fully empty bladder: Yes:   Stream: Strong Urgency: Yes  Frequency: 6-7 times/ day  Leakage: Urge to void, Walking to the bathroom, Coughing, Sneezing, Laughing, Exercise, and Lifting Pads: No could   INTERCOURSE: no issues   PREGNANCY: Vaginal deliveries 2 Tearing Yes:   Episiotomy no  C-section deliveries 0 Currently pregnant No  PROLAPSE: None   OBJECTIVE:  Note: Objective measures were completed at Evaluation unless otherwise noted.     PFIQ-7: 10  COGNITION: Overall cognitive status: Within functional limits for tasks assessed     SENSATION: Light touch: Appears intact    POSTURE: No Significant postural limitations   LOWER EXTREMITY MMT: grossly 4/5 overall at least PALPATION:   General: within functional limitations   Pelvic Alignment:even  Abdominal: low                External Perineal Exam: to be assessed                             Internal Pelvic Floor: to be assessed  Patient confirms identification and approves PT to assess internal pelvic floor and treatment No  PELVIC MMT:   MMT eval  Vaginal   Internal Anal Sphincter   External Anal Sphincter   Puborectalis   Diastasis Recti   (Blank rows = not tested)        TONE: To be assessed   PROLAPSE: To be assessed  TODAY'S TREATMENT:                                                                                                                              DATE: 09/21/23   EVAL see below   PATIENT EDUCATION/ there acts: :  Education details: relevant anatomy, HEP, urge drill, bladder  irritants, expectations of PT Person educated: Patient Education method: Explanation, Demonstration, Tactile cues, Verbal cues, and Handouts Education comprehension: verbalized understanding, returned demonstration, verbal cues required, tactile cues required, and needs further education  HOME EXERCISE PROGRAM:  Access Code: [email or web address] URL: [email or web address] Date: 09/21/2023 Prepared by: Darrell Jewel Emmitt Matthews  Exercises - Seated Quick Flick Pelvic Floor Contractions  - 1 x daily - 7 x weekly - 3 sets - 10 reps  Patient Education - Urinary Urge Control Techniques - Get To Know Your Pelvic Floor- Female - Bladder Retraining - Lifestyle Changes to Support Urinary and Bladder Health - Urinary Urge Control Techniques  ASSESSMENT:  CLINICAL IMPRESSION: Patient is a 48 y.o. F who was seen today for physical therapy evaluation and treatment for mixed incontinence.  She did well with education and will benefit from PT. Pt to start a 2 week water only reset.   OBJECTIVE IMPAIRMENTS: decreased coordination and decreased strength.   ACTIVITY LIMITATIONS: continence and toileting  PARTICIPATION LIMITATIONS: community activity and occupation  PERSONAL FACTORS: Time since onset of injury/illness/exacerbation are also affecting patient's functional outcome.   REHAB POTENTIAL: Good  CLINICAL DECISION MAKING: Stable/uncomplicated  EVALUATION COMPLEXITY: Low   GOALS: Goals reviewed with patient? Yes  SHORT TERM  GOALS: Target date: 10/19/2023    Pt will be independent with HEP.   Baseline: Goal status: INITIAL  2.  Pt will be independent with the knack, urge suppression technique, and double voiding in order to improve bladder habits and decrease urinary incontinence.   Baseline:  Goal status: INITIAL  3.  Pt will be able to functional actions such as walk to the bathroom without leakage  Baseline:  Goal status: INITIAL  4.  Pt will be able to run/workout for 15 minutes  without leakage or discomfort  Baseline:  Goal status: INITIAL   LONG TERM GOALS: Target date: 03/23/2024  Pt will be independent with advanced HEP.   Baseline:  Goal status: INITIAL  2.  Pt will soak 0 pads/ day Baseline:  Goal status: INITIAL  3.  Pt will be able to cough, jump and sneeze without leaking Baseline:  Goal status: INITIAL   PLAN:  PT FREQUENCY: 1-2x/week  PT DURATION: 6 months  PLANNED INTERVENTIONS: 97110-Therapeutic exercises, 97530- Therapeutic activity, 97112- Neuromuscular re-education, 97535- Self Care, [id number]- Manual therapy, 97032- Electrical stimulation (manual), Taping, Dry Needling, Joint mobilization, Joint manipulation, Spinal manipulation, Spinal mobilization, Scar mobilization, Cryotherapy, Moist heat, and Biofeedback  PLAN FOR NEXT SESSION: internal and cont core exercises   Kenidy Crossland, PT 09/21/2023, 5:50 PM

## 2023-09-21 ENCOUNTER — Other Ambulatory Visit: Payer: Self-pay

## 2023-09-21 ENCOUNTER — Ambulatory Visit: Payer: BC Managed Care – PPO | Attending: Obstetrics & Gynecology | Admitting: Physical Therapy

## 2023-09-21 ENCOUNTER — Encounter: Payer: Self-pay | Admitting: Physical Therapy

## 2023-09-21 DIAGNOSIS — R279 Unspecified lack of coordination: Secondary | ICD-10-CM | POA: Insufficient documentation

## 2023-09-21 DIAGNOSIS — N393 Stress incontinence (female) (male): Secondary | ICD-10-CM | POA: Insufficient documentation

## 2023-09-21 DIAGNOSIS — M6281 Muscle weakness (generalized): Secondary | ICD-10-CM | POA: Diagnosis not present

## 2023-09-28 ENCOUNTER — Ambulatory Visit: Payer: BC Managed Care – PPO | Admitting: Physical Therapy

## 2023-10-05 ENCOUNTER — Encounter: Payer: BC Managed Care – PPO | Admitting: Physical Therapy

## 2023-10-19 ENCOUNTER — Encounter: Payer: BC Managed Care – PPO | Admitting: Physical Therapy

## 2024-01-12 DIAGNOSIS — I10 Essential (primary) hypertension: Secondary | ICD-10-CM | POA: Diagnosis not present

## 2024-01-12 DIAGNOSIS — Z1211 Encounter for screening for malignant neoplasm of colon: Secondary | ICD-10-CM | POA: Diagnosis not present

## 2024-01-12 DIAGNOSIS — Z0000 Encounter for general adult medical examination without abnormal findings: Secondary | ICD-10-CM | POA: Diagnosis not present

## 2024-01-17 DIAGNOSIS — Z1211 Encounter for screening for malignant neoplasm of colon: Secondary | ICD-10-CM | POA: Diagnosis not present

## 2024-03-30 ENCOUNTER — Encounter (HOSPITAL_BASED_OUTPATIENT_CLINIC_OR_DEPARTMENT_OTHER): Payer: Self-pay | Admitting: Obstetrics & Gynecology

## 2024-03-30 ENCOUNTER — Other Ambulatory Visit (HOSPITAL_BASED_OUTPATIENT_CLINIC_OR_DEPARTMENT_OTHER): Payer: Self-pay

## 2024-03-30 DIAGNOSIS — A6000 Herpesviral infection of urogenital system, unspecified: Secondary | ICD-10-CM

## 2024-03-30 MED ORDER — VALACYCLOVIR HCL 500 MG PO TABS: ORAL_TABLET | ORAL | 1 refills | Status: AC

## 2024-04-18 DIAGNOSIS — D229 Melanocytic nevi, unspecified: Secondary | ICD-10-CM | POA: Diagnosis not present

## 2024-04-18 DIAGNOSIS — L814 Other melanin hyperpigmentation: Secondary | ICD-10-CM | POA: Diagnosis not present

## 2024-04-18 DIAGNOSIS — L578 Other skin changes due to chronic exposure to nonionizing radiation: Secondary | ICD-10-CM | POA: Diagnosis not present
